# Patient Record
Sex: Male | Born: 1975 | Race: Black or African American | Hispanic: No | Smoking: Current every day smoker
Health system: Southern US, Community
[De-identification: ages and names within clinical notes are randomized; demographics above are authoritative.]

## PROBLEM LIST (undated history)

## (undated) DIAGNOSIS — Z789 Other specified health status: Secondary | ICD-10-CM

---

## 2001-12-24 ENCOUNTER — Encounter: Payer: Self-pay | Admitting: Emergency Medicine

## 2001-12-24 ENCOUNTER — Emergency Department (HOSPITAL_COMMUNITY): Admission: EM | Admit: 2001-12-24 | Discharge: 2001-12-24 | Payer: Self-pay | Admitting: Emergency Medicine

## 2002-10-06 ENCOUNTER — Encounter: Payer: Self-pay | Admitting: Emergency Medicine

## 2002-10-06 ENCOUNTER — Emergency Department (HOSPITAL_COMMUNITY): Admission: EM | Admit: 2002-10-06 | Discharge: 2002-10-06 | Payer: Self-pay | Admitting: Emergency Medicine

## 2004-02-05 ENCOUNTER — Emergency Department (HOSPITAL_COMMUNITY): Admission: EM | Admit: 2004-02-05 | Discharge: 2004-02-05 | Payer: Self-pay | Admitting: *Deleted

## 2004-04-06 ENCOUNTER — Emergency Department (HOSPITAL_COMMUNITY): Admission: EM | Admit: 2004-04-06 | Discharge: 2004-04-06 | Payer: Self-pay | Admitting: Emergency Medicine

## 2007-01-08 ENCOUNTER — Emergency Department (HOSPITAL_COMMUNITY): Admission: EM | Admit: 2007-01-08 | Discharge: 2007-01-08 | Payer: Self-pay | Admitting: Family Medicine

## 2007-04-30 ENCOUNTER — Emergency Department (HOSPITAL_COMMUNITY): Admission: EM | Admit: 2007-04-30 | Discharge: 2007-04-30 | Payer: Self-pay | Admitting: Emergency Medicine

## 2008-11-17 ENCOUNTER — Emergency Department (HOSPITAL_COMMUNITY): Admission: EM | Admit: 2008-11-17 | Discharge: 2008-11-17 | Payer: Self-pay | Admitting: Family Medicine

## 2010-01-30 ENCOUNTER — Emergency Department (HOSPITAL_COMMUNITY): Admission: EM | Admit: 2010-01-30 | Discharge: 2010-01-30 | Payer: Self-pay | Admitting: Family Medicine

## 2010-01-30 ENCOUNTER — Emergency Department (HOSPITAL_COMMUNITY): Admission: EM | Admit: 2010-01-30 | Discharge: 2010-01-30 | Payer: Self-pay | Admitting: Emergency Medicine

## 2010-04-20 ENCOUNTER — Emergency Department (HOSPITAL_COMMUNITY): Admission: EM | Admit: 2010-04-20 | Discharge: 2010-04-21 | Payer: Self-pay | Admitting: Emergency Medicine

## 2010-10-25 LAB — POCT CARDIAC MARKERS: Troponin i, poc: <0.05 ng/mL (ref 0.00–0.09)

## 2010-10-25 LAB — POCT I-STAT, CHEM 8
BUN: 9 mg/dL (ref 6–23)
Calcium, Ion: 1.19 mmol/L (ref 1.12–1.32)
Chloride: 104 mEq/L (ref 96–112)
Creatinine, Ser: 0.9 mg/dL (ref 0.4–1.5)
Glucose, Bld: 106 mg/dL — ABNORMAL HIGH (ref 70–99)
TCO2: 27 mmol/L (ref 0–100)

## 2010-10-25 LAB — RAPID URINE DRUG SCREEN, HOSP PERFORMED: Tetrahydrocannabinol: POSITIVE — AB

## 2017-01-30 ENCOUNTER — Encounter (HOSPITAL_COMMUNITY): Payer: Self-pay | Admitting: Emergency Medicine

## 2017-01-30 DIAGNOSIS — S02609A Fracture of mandible, unspecified, initial encounter for closed fracture: Principal | ICD-10-CM | POA: Insufficient documentation

## 2017-01-30 DIAGNOSIS — F17200 Nicotine dependence, unspecified, uncomplicated: Secondary | ICD-10-CM | POA: Insufficient documentation
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
## 2017-01-31 ENCOUNTER — Observation Stay (HOSPITAL_COMMUNITY)
Admission: EM | Admit: 2017-01-31 | Discharge: 2017-02-01 | Disposition: A | Discharge status: Home / Self Care | Payer: Self-pay | Attending: Otolaryngology | Admitting: Otolaryngology

## 2017-01-31 ENCOUNTER — Emergency Department (HOSPITAL_COMMUNITY): Payer: Self-pay

## 2017-01-31 ENCOUNTER — Inpatient Hospital Stay (HOSPITAL_COMMUNITY): Payer: Self-pay | Admitting: Certified Registered Nurse Anesthetist

## 2017-01-31 ENCOUNTER — Encounter (HOSPITAL_COMMUNITY)
Admission: EM | Disposition: A | Discharge status: Home / Self Care | Payer: Self-pay | Source: Home / Self Care | Attending: Otolaryngology

## 2017-01-31 DIAGNOSIS — S02602A Fracture of unspecified part of body of left mandible, initial encounter for closed fracture: Secondary | ICD-10-CM

## 2017-01-31 DIAGNOSIS — S02609A Fracture of mandible, unspecified, initial encounter for closed fracture: Secondary | ICD-10-CM | POA: Diagnosis present

## 2017-01-31 DIAGNOSIS — K029 Dental caries, unspecified: Secondary | ICD-10-CM

## 2017-01-31 HISTORY — PX: ORIF MANDIBULAR FRACTURE: SHX2127

## 2017-01-31 LAB — CBC WITH DIFFERENTIAL/PLATELET
BASOS ABS: 0 10*3/uL (ref 0.0–0.1)
BASOS PCT: 0 %
EOS PCT: 0 %
Eosinophils Absolute: 0.1 10*3/uL (ref 0.0–0.7)
HCT: 41.4 % (ref 39.0–52.0)
Hemoglobin: 14.2 g/dL (ref 13.0–17.0)
Lymphocytes Relative: 12 %
Lymphs Abs: 1.6 10*3/uL (ref 0.7–4.0)
MCH: 29.9 pg (ref 26.0–34.0)
MCHC: 34.3 g/dL (ref 30.0–36.0)
MCV: 87.2 fL (ref 78.0–100.0)
MONO ABS: 0.7 10*3/uL (ref 0.1–1.0)
Monocytes Relative: 6 %
NEUTROS ABS: 10.9 10*3/uL — AB (ref 1.7–7.7)
Neutrophils Relative %: 82 %
Platelets: 266 10*3/uL (ref 150–400)
RBC: 4.75 MIL/uL (ref 4.22–5.81)
RDW: 14.4 % (ref 11.5–15.5)
WBC: 13.3 10*3/uL — ABNORMAL HIGH (ref 4.0–10.5)

## 2017-01-31 LAB — BASIC METABOLIC PANEL
Anion gap: 12 (ref 5–15)
BUN: 8 mg/dL (ref 6–20)
CHLORIDE: 103 mmol/L (ref 101–111)
CO2: 22 mmol/L (ref 22–32)
CREATININE: 0.77 mg/dL (ref 0.61–1.24)
Calcium: 9.4 mg/dL (ref 8.9–10.3)
GFR calc Af Amer: >60 mL/min (ref >60)
GFR calc non Af Amer: >60 mL/min (ref >60)
GLUCOSE: 120 mg/dL — AB (ref 65–99)
POTASSIUM: 3.6 mmol/L (ref 3.5–5.1)
Sodium: 137 mmol/L (ref 135–145)

## 2017-01-31 SURGERY — OPEN REDUCTION INTERNAL FIXATION (ORIF) MANDIBULAR FRACTURE
Anesthesia: General | Site: Mouth

## 2017-01-31 MED ORDER — SUCCINYLCHOLINE CHLORIDE 200 MG/10ML IV SOSY
PREFILLED_SYRINGE | INTRAVENOUS | Status: AC
  Filled 2017-01-31: qty 10

## 2017-01-31 MED ORDER — CLINDAMYCIN HCL 300 MG PO CAPS: 300.0000 mg | ORAL_CAPSULE | Freq: Three times a day (TID) | ORAL | 0 refills | Status: DC

## 2017-01-31 MED ORDER — HYDROCODONE-ACETAMINOPHEN 7.5-325 MG/15ML PO SOLN: 15.0000 mL | Freq: Four times a day (QID) | ORAL | 0 refills | Status: DC | PRN

## 2017-01-31 MED ORDER — BACITRACIN ZINC 500 UNIT/GM EX OINT
TOPICAL_OINTMENT | CUTANEOUS | Status: AC
  Filled 2017-01-31: qty 28.35

## 2017-01-31 MED ORDER — ONDANSETRON HCL 4 MG/2ML IJ SOLN
4.0000 mg | Freq: Once | INTRAMUSCULAR | Status: AC
  Administered 2017-01-31: 4 mg via INTRAVENOUS
  Filled 2017-01-31: qty 2

## 2017-01-31 MED ORDER — ONDANSETRON HCL 4 MG/2ML IJ SOLN: 4.0000 mg | Freq: Three times a day (TID) | INTRAMUSCULAR | Status: AC | PRN

## 2017-01-31 MED ORDER — OXYMETAZOLINE HCL 0.05 % NA SOLN
NASAL | Status: AC
  Filled 2017-01-31: qty 15

## 2017-01-31 MED ORDER — PROPOFOL 10 MG/ML IV BOLUS
INTRAVENOUS | Status: AC
  Filled 2017-01-31: qty 20

## 2017-01-31 MED ORDER — OXYMETAZOLINE HCL 0.05 % NA SOLN
NASAL | Status: DC | PRN
  Administered 2017-01-31: 1

## 2017-01-31 MED ORDER — MIDAZOLAM HCL 2 MG/2ML IJ SOLN
INTRAMUSCULAR | Status: DC | PRN
  Administered 2017-01-31: 2 mg via INTRAVENOUS

## 2017-01-31 MED ORDER — SUGAMMADEX SODIUM 200 MG/2ML IV SOLN
INTRAVENOUS | Status: DC | PRN
  Administered 2017-01-31: 150 mg via INTRAVENOUS

## 2017-01-31 MED ORDER — MIDAZOLAM HCL 2 MG/2ML IJ SOLN
INTRAMUSCULAR | Status: AC
  Filled 2017-01-31: qty 2

## 2017-01-31 MED ORDER — LIDOCAINE-EPINEPHRINE 1 %-1:100000 IJ SOLN
INTRAMUSCULAR | Status: AC
  Filled 2017-01-31: qty 1

## 2017-01-31 MED ORDER — SODIUM CHLORIDE 0.9 % IV SOLN
INTRAVENOUS | Status: DC
  Administered 2017-01-31: 04:00:00 via INTRAVENOUS

## 2017-01-31 MED ORDER — DEXTROSE-NACL 5-0.9 % IV SOLN
INTRAVENOUS | Status: DC
  Administered 2017-01-31 – 2017-02-01 (×3): via INTRAVENOUS

## 2017-01-31 MED ORDER — LACTATED RINGERS IV SOLN
INTRAVENOUS | Status: DC
  Administered 2017-01-31 (×2): via INTRAVENOUS

## 2017-01-31 MED ORDER — PROPOFOL 10 MG/ML IV BOLUS
INTRAVENOUS | Status: DC | PRN
  Administered 2017-01-31: 200 mg via INTRAVENOUS

## 2017-01-31 MED ORDER — FENTANYL CITRATE (PF) 250 MCG/5ML IJ SOLN
INTRAMUSCULAR | Status: AC
  Filled 2017-01-31: qty 5

## 2017-01-31 MED ORDER — FENTANYL CITRATE (PF) 100 MCG/2ML IJ SOLN
100.0000 ug | Freq: Once | INTRAMUSCULAR | Status: AC
  Administered 2017-01-31: 100 ug via INTRAVENOUS
  Filled 2017-01-31: qty 2

## 2017-01-31 MED ORDER — CLINDAMYCIN PHOSPHATE 600 MG/50ML IV SOLN
INTRAVENOUS | Status: AC
  Filled 2017-01-31: qty 50

## 2017-01-31 MED ORDER — IBUPROFEN 100 MG/5ML PO SUSP
400.0000 mg | Freq: Four times a day (QID) | ORAL | Status: DC | PRN
  Administered 2017-02-01: 400 mg via ORAL
  Filled 2017-01-31 (×3): qty 20

## 2017-01-31 MED ORDER — ROCURONIUM BROMIDE 10 MG/ML (PF) SYRINGE
PREFILLED_SYRINGE | INTRAVENOUS | Status: AC
  Filled 2017-01-31: qty 5

## 2017-01-31 MED ORDER — PROMETHAZINE HCL 25 MG RE SUPP: 25.0000 mg | Freq: Four times a day (QID) | RECTAL | 1 refills | Status: DC | PRN

## 2017-01-31 MED ORDER — FENTANYL CITRATE (PF) 100 MCG/2ML IJ SOLN: 50.0000 ug | INTRAMUSCULAR | Status: DC | PRN

## 2017-01-31 MED ORDER — DEXAMETHASONE SODIUM PHOSPHATE 10 MG/ML IJ SOLN
INTRAMUSCULAR | Status: DC | PRN
  Administered 2017-01-31: 10 mg via INTRAVENOUS

## 2017-01-31 MED ORDER — DEXAMETHASONE SODIUM PHOSPHATE 10 MG/ML IJ SOLN
INTRAMUSCULAR | Status: AC
  Filled 2017-01-31: qty 1

## 2017-01-31 MED ORDER — CLINDAMYCIN PHOSPHATE 900 MG/50ML IV SOLN
900.0000 mg | INTRAVENOUS | Status: AC
  Administered 2017-01-31: 900 mg via INTRAVENOUS
  Filled 2017-01-31: qty 50

## 2017-01-31 MED ORDER — ROCURONIUM BROMIDE 100 MG/10ML IV SOLN
INTRAVENOUS | Status: DC | PRN
  Administered 2017-01-31: 65 mg via INTRAVENOUS

## 2017-01-31 MED ORDER — HYDROCODONE-ACETAMINOPHEN 7.5-325 MG/15ML PO SOLN
10.0000 mL | ORAL | Status: DC | PRN
  Administered 2017-01-31 – 2017-02-01 (×6): 15 mL via ORAL
  Filled 2017-01-31 (×7): qty 15

## 2017-01-31 MED ORDER — FENTANYL CITRATE (PF) 100 MCG/2ML IJ SOLN
INTRAMUSCULAR | Status: DC | PRN
  Administered 2017-01-31: 50 ug via INTRAVENOUS
  Administered 2017-01-31: 150 ug via INTRAVENOUS
  Administered 2017-01-31: 50 ug via INTRAVENOUS

## 2017-01-31 MED ORDER — ONDANSETRON HCL 4 MG/2ML IJ SOLN
INTRAMUSCULAR | Status: AC
  Filled 2017-01-31: qty 2

## 2017-01-31 MED ORDER — PROMETHAZINE HCL 25 MG RE SUPP: 25.0000 mg | Freq: Four times a day (QID) | RECTAL | Status: DC | PRN

## 2017-01-31 MED ORDER — DEXTROSE-NACL 5-0.45 % IV SOLN
INTRAVENOUS | Status: DC
  Administered 2017-01-31: 10:00:00 via INTRAVENOUS

## 2017-01-31 MED ORDER — PROMETHAZINE HCL 25 MG PO TABS: 25.0000 mg | ORAL_TABLET | Freq: Four times a day (QID) | ORAL | Status: DC | PRN

## 2017-01-31 MED ORDER — SUGAMMADEX SODIUM 200 MG/2ML IV SOLN
INTRAVENOUS | Status: AC
  Filled 2017-01-31: qty 2

## 2017-01-31 MED ORDER — SODIUM CHLORIDE 0.9 % IV SOLN
Freq: Once | INTRAVENOUS | Status: AC
  Administered 2017-01-31: 02:00:00 via INTRAVENOUS

## 2017-01-31 MED ORDER — OXYMETAZOLINE HCL 0.05 % NA SOLN
2.0000 | NASAL | Status: DC
  Administered 2017-01-31: 2 via NASAL

## 2017-01-31 MED ORDER — CLINDAMYCIN PHOSPHATE 600 MG/50ML IV SOLN
600.0000 mg | Freq: Once | INTRAVENOUS | Status: AC
  Administered 2017-01-31: 600 mg via INTRAVENOUS
  Filled 2017-01-31: qty 50

## 2017-01-31 MED ORDER — CLINDAMYCIN PHOSPHATE 900 MG/50ML IV SOLN
INTRAVENOUS | Status: AC
  Filled 2017-01-31: qty 50

## 2017-01-31 MED ORDER — ONDANSETRON HCL 4 MG/2ML IJ SOLN
INTRAMUSCULAR | Status: DC | PRN
  Administered 2017-01-31: 4 mg via INTRAVENOUS

## 2017-01-31 MED ORDER — LIDOCAINE 2% (20 MG/ML) 5 ML SYRINGE
INTRAMUSCULAR | Status: AC
  Filled 2017-01-31: qty 5

## 2017-01-31 MED ORDER — CLINDAMYCIN PALMITATE HCL 75 MG/5ML PO SOLR
300.0000 mg | Freq: Three times a day (TID) | ORAL | Status: DC
  Administered 2017-01-31 – 2017-02-01 (×3): 300 mg via ORAL
  Filled 2017-01-31 (×4): qty 20

## 2017-01-31 MED ORDER — LIDOCAINE 2% (20 MG/ML) 5 ML SYRINGE
INTRAMUSCULAR | Status: DC | PRN
  Administered 2017-01-31: 100 mg via INTRAVENOUS

## 2017-01-31 MED ORDER — 0.9 % SODIUM CHLORIDE (POUR BTL) OPTIME
TOPICAL | Status: DC | PRN
  Administered 2017-01-31: 1000 mL

## 2017-01-31 MED ORDER — FENTANYL CITRATE (PF) 100 MCG/2ML IJ SOLN
50.0000 ug | INTRAMUSCULAR | Status: DC | PRN
  Administered 2017-01-31 (×2): 50 ug via INTRAVENOUS
  Filled 2017-01-31 (×2): qty 2

## 2017-01-31 SURGICAL SUPPLY — 33 items
BLADE SURG 15 STRL LF DISP TIS (BLADE) IMPLANT
BLADE SURG 15 STRL SS (BLADE)
CANISTER SUCT 3000ML PPV (MISCELLANEOUS) ×3 IMPLANT
CLEANER TIP ELECTROSURG 2X2 (MISCELLANEOUS) ×3 IMPLANT
COVER SURGICAL LIGHT HANDLE (MISCELLANEOUS) ×3 IMPLANT
DRAPE HALF SHEET 40X57 (DRAPES) ×3 IMPLANT
ELECT COATED BLADE 2.86 ST (ELECTRODE) IMPLANT
ELECT NEEDLE TIP 2.8 STRL (NEEDLE) IMPLANT
ELECT REM PT RETURN 9FT ADLT (ELECTROSURGICAL)
ELECTRODE REM PT RTRN 9FT ADLT (ELECTROSURGICAL) IMPLANT
GLOVE ECLIPSE 7.5 STRL STRAW (GLOVE) ×3 IMPLANT
GOWN STRL REUS W/ TWL LRG LVL3 (GOWN DISPOSABLE) ×2 IMPLANT
GOWN STRL REUS W/TWL LRG LVL3 (GOWN DISPOSABLE) ×4
KIT BASIN OR (CUSTOM PROCEDURE TRAY) ×3 IMPLANT
KIT ROOM TURNOVER OR (KITS) ×3 IMPLANT
NEEDLE PRECISIONGLIDE 27X1.5 (NEEDLE) ×3 IMPLANT
NS IRRIG 1000ML POUR BTL (IV SOLUTION) ×3 IMPLANT
PAD ARMBOARD 7.5X6 YLW CONV (MISCELLANEOUS) ×6 IMPLANT
PENCIL FOOT CONTROL (ELECTRODE) ×3 IMPLANT
PLATE HYBRID MMF (Plate) ×6 IMPLANT
SCISSORS WIRE ANG 4 3/4 DISP (INSTRUMENTS) ×3 IMPLANT
SCREW LOCK SELFDRIL 2.0X8M MMF (Screw) ×15 IMPLANT
SCREW LOCKING SELF DRILL 2.0X6 (Screw) ×18 IMPLANT
SUT CHROMIC 3 0 PS 2 (SUTURE) ×3 IMPLANT
SUT STEEL 0 (SUTURE)
SUT STEEL 0 18XMFL TIE 17 (SUTURE) IMPLANT
SUT STEEL 2 (SUTURE) IMPLANT
SUT STEEL 4 (SUTURE) IMPLANT
SUT VICRYL 4-0 PS2 18IN ABS (SUTURE) IMPLANT
TOWEL OR 17X24 6PK STRL BLUE (TOWEL DISPOSABLE) ×3 IMPLANT
TRAY ENT MC OR (CUSTOM PROCEDURE TRAY) ×3 IMPLANT
WATER STERILE IRR 1000ML POUR (IV SOLUTION) ×3 IMPLANT
WIRE K 1.1 (MISCELLANEOUS) ×3 IMPLANT

## 2017-01-31 NOTE — Transfer of Care (Signed)
Immediate Anesthesia Transfer of Care Note  Patient: Randy Strickland  Procedure(s) Performed: Procedure(s): OPEN REDUCTION INTERNAL FIXATION (ORIF) MANDIBULAR FRACTURE (N/A)  Patient Location: PACU  Anesthesia Type:General  Level of Consciousness: drowsy  Airway & Oxygen Therapy: Patient Spontanous Breathing and Patient connected to nasal cannula oxygen  Post-op Assessment: Report given to RN, Post -op Vital signs reviewed and stable and Patient moving all extremities  Post vital signs: Reviewed and stable  Last Vitals:  Vitals:   01/31/17 0217 01/31/17 0404  BP: 134/76 134/72  Pulse: 66 (!) 58  Resp: 20 18  Temp:  36.7 C    Last Pain:  Vitals:   01/31/17 0616  TempSrc:   PainSc: 8       Patients Stated Pain Goal: 2 (01/31/17 0616)  Complications: No apparent anesthesia complications

## 2017-01-31 NOTE — ED Notes (Signed)
[facility] called for transfer to .  

## 2017-01-31 NOTE — Anesthesia Preprocedure Evaluation (Addendum)
Anesthesia Evaluation  Patient identified by MRN, date of birth, ID band Patient awake    Reviewed: Allergy & Precautions, H&P , Patient's Chart, lab work & pertinent test results, reviewed documented beta blocker date and time   Airway Mallampati: II  TM Distance: >3 FB Neck ROM: full    Dental no notable dental hx.    Pulmonary Current Smoker,    Pulmonary exam normal breath sounds clear to auscultation       Cardiovascular  Rhythm:regular Rate:Normal     Neuro/Psych    GI/Hepatic   Endo/Other    Renal/GU      Musculoskeletal   Abdominal   Peds  Hematology   Anesthesia Other Findings   Reproductive/Obstetrics                             Anesthesia Physical Anesthesia Plan  ASA: II  Anesthesia Plan: General   Post-op Pain Management:    Induction: Intravenous  PONV Risk Score and Plan: 2 and Ondansetron, Dexamethasone, Propofol, Scopolamine patch - Pre-op and Promethazine  Airway Management Planned: Nasal ETT  Additional Equipment:   Intra-op Plan:   Post-operative Plan: Extubation in OR  Informed Consent: I have reviewed the patients History and Physical, chart, labs and discussed the procedure including the risks, benefits and alternatives for the proposed anesthesia with the patient or authorized representative who has indicated his/her understanding and acceptance.   Dental Advisory Given  Plan Discussed with: CRNA and Surgeon  Anesthesia Plan Comments: (  )      Anesthesia Quick Evaluation

## 2017-01-31 NOTE — Discharge Instructions (Signed)
Liquids only.  Always have the wire cutter with you. In case of vomiting, cut 4 wires to open the mouth.  Bruch teeth twice daily as well as you can.  Rinse mouth with salt water 3-4 times daily.  It is OK to open the antibiotic (clindamycin) capsules and to drink the granules with liquid.
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
## 2017-01-31 NOTE — Anesthesia Procedure Notes (Signed)
Procedure Name: Intubation Date/Time: 01/31/2017 8:26 AM Performed by: Trixie Deis A Pre-anesthesia Checklist: Patient identified, Emergency Drugs available, Suction available, Patient being monitored and Timeout performed Patient Re-evaluated:Patient Re-evaluated prior to inductionOxygen Delivery Method: Circle system utilized Preoxygenation: Pre-oxygenation with 100% oxygen Intubation Type: IV induction Ventilation: Mask ventilation without difficulty Laryngoscope Size: Mac and 4 Grade View: Grade I Nasal Tubes: Right, Nasal prep performed, Nasal Rae and Magill forceps- large, utilized Tube size: 7.5 mm Number of attempts: 2 Placement Confirmation: ETT inserted through vocal cords under direct vision and breath sounds checked- equal and bilateral Secured at: 29 cm Tube secured with: Tape

## 2017-01-31 NOTE — Progress Notes (Signed)
Dr. Pollyann Kennedyosen notified of patient's arrival.

## 2017-01-31 NOTE — Progress Notes (Signed)
Patient back from PACU. Suction and wire cutters at bedside.
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
## 2017-01-31 NOTE — Progress Notes (Signed)
   Subjective:    Patient ID: Randy Strickland, male    DOB: 08-30-1975, 41 y.o.   MRN: [id number]  HPI Doing well.  Not much pain.  Review of Systems     Objective:   Physical Exam AF VSS Alert, NAD Teeth in tight occlusion with MMF bars in place    Assessment & Plan:  Mandible fracture s/p MMF  Doing well.  He prefers to stay overnight.  Plan discharge tomorrow.

## 2017-01-31 NOTE — Op Note (Signed)
OPERATIVE REPORT  DATE OF SURGERY: 01/31/2017  PATIENT:  Myna HidalgoFrankie J Hildenbrand,  41 y.o. male  PRE-OPERATIVE DIAGNOSIS:  fractured mandible  POST-OPERATIVE DIAGNOSIS:  fractured mandible  PROCEDURE:  Procedure(s): OPEN REDUCTION INTERNAL FIXATION (ORIF) MANDIBULAR FRACTURE  SURGEON:  Susy FrizzleJefry H Ninel Abdella, MD  ASSISTANTS: none  ANESTHESIA:   General   EBL:  5 ml  DRAINS: none  LOCAL MEDICATIONS USED:  None  SPECIMEN:  none  COUNTS:  Correct  PROCEDURE DETAILS: The patient was taken to the operating room and placed on the operating table in the supine position. Following induction of general nasotracheal anesthesia, the face was draped in a standard fashion. A cheek retractor was used throughout the case. The dentition was inspected. Heath and fractured teeth. There was an open bite deformity. There is an obvious displaced fracture of the left angle/ascending ramus. Arch bars were applied to the maxilla and the mandible using the drilling 8 mm screws in the maxilla and 9 mm in the mandible. Care was taken to avoid tooth roots. 24-gauge wire loops were then used to bring the occlusion into position and secured the maxilla mandibular fixation. A total of 4 wires were used. A patient of the angle on the left side after the fixation was anatomically aligned. The oral cavity was irrigated with saline and suctioned. Patient was then awakened and admitted and transferred to recovery in stable condition.    PATIENT DISPOSITION:  To PACU, stable

## 2017-02-01 NOTE — Discharge Planning (Signed)
Patient discharged home in stable condition. Verbalizes understanding of all discharge instructions, including home medications and follow up appointments. 

## 2017-02-01 NOTE — Discharge Summary (Signed)
Physician Discharge Summary  Patient ID: Randy HidalgoFrankie J Scheaffer MRN: [id number] DOB/AGE: 09-08-1975 41 y.o.  Admit date: 01/31/2017 Discharge date: 02/01/2017  Admission Diagnoses: Mandible fracture  Discharge Diagnoses:  Active Problems:   Mandible fracture (HCC)   Fracture, mandible (HCC)   Discharged Condition: good  Hospital Course: 41 year old male with mandible fracture presented for maxillomandibular fixation.  See operative note.  He was observed overnight and did well with reasonably good pain control.  On POD 1, he was felt stable for discharge.  Consults: None  Significant Diagnostic Studies: None  Treatments: surgery: Maxillomandibular fixation  Discharge Exam: Blood pressure 106/62, pulse (!) 54, temperature 98.1 F (36.7 C), temperature source Axillary, resp. rate 18, height 5\' 10"  (1.778 m), weight 163 lb 4.8 oz (74.1 kg), SpO2 98 %. General appearance: alert, cooperative and no distress Head: Teeth in normal, tight occlusion.  Arch bars and wires in place.  Disposition: Final discharge disposition not confirmed  Discharge Instructions    Diet - low sodium heart healthy    Complete by:  As directed    Discharge instructions    Complete by:  As directed    Diet consists of anything you can get through your teeth.  Drink plenty of fluids including Boost or Ensure shakes.  Blended foods or other thin solids can also be eaten.  OK to gently brush teeth around wires.  Call if wires break or loosen.  Keep a pair of wire cutters with you at all times and cut wires only in emergency such as needing to throw up.  If you cut the wires, call Dr. Pollyann Kennedyosen immediately.   Increase activity slowly    Complete by:  As directed      Allergies as of 02/01/2017   No Known Allergies     Medication List    TAKE these medications   clindamycin 300 MG capsule Commonly known as:  CLEOCIN Take 1 capsule (300 mg total) by mouth 3 (three) times daily.   HYDROcodone-acetaminophen  7.5-325 mg/15 ml solution Commonly known as:  HYCET Take 15 mLs by mouth 4 (four) times daily as needed for moderate pain.   promethazine 25 MG suppository Commonly known as:  PHENERGAN Place 1 suppository (25 mg total) rectally every 6 (six) hours as needed for nausea or vomiting.      Follow-up Information    Serena Colonelosen, Jefry, MD. Schedule an appointment as soon as possible for a visit in 1 week(s).   Specialty:  Otolaryngology Contact information: [address] [place] [address] [phone]           Signed: Christia ReadingBATES, Gopal Malter 02/01/2017, 8:44 AM

## 2017-02-03 ENCOUNTER — Encounter (HOSPITAL_COMMUNITY): Payer: Self-pay | Admitting: Otolaryngology

## 2017-02-03 NOTE — Anesthesia Postprocedure Evaluation (Signed)
Anesthesia Post Note  Patient: Randy Strickland  Procedure(s) Performed: Procedure(s) (LRB): OPEN REDUCTION INTERNAL FIXATION (ORIF) MANDIBULAR FRACTURE (N/A)     Patient location during evaluation: PACU Anesthesia Type: General Level of consciousness: awake and alert Pain management: pain level controlled Vital Signs Assessment: post-procedure vital signs reviewed and stable Respiratory status: spontaneous breathing, nonlabored ventilation, respiratory function stable and patient connected to nasal cannula oxygen Cardiovascular status: blood pressure returned to baseline and stable Postop Assessment: no signs of nausea or vomiting Anesthetic complications: no    Last Vitals:  Vitals:   02/01/17 0203 02/01/17 0500  BP: 119/61 106/62  Pulse: (!) 58 (!) 54  Resp: 18 18  Temp: 36.6 C 36.7 C    Last Pain:  Vitals:   02/01/17 0940  TempSrc:   PainSc: 2                  Randy Strickland EDWARD

## 2017-03-05 ENCOUNTER — Other Ambulatory Visit: Payer: Self-pay | Admitting: Otolaryngology

## 2017-03-06 NOTE — Pre-Procedure Instructions (Signed)
Randy Strickland  03/06/2017      [facility] - [place], [place] - [address] [place] [id number] Phone: [phone] Fax: [phone]    Your procedure is scheduled on August 1  [facility] at 0800 A.M.  Call this number if you have problems the morning of surgery:  [phone]   Remember:  Do not eat food or drink liquids after midnight.   Take these medicines the morning of surgery with A SIP OF WATER  None  7 days prior to surgery STOP taking any Aspirin, Aleve, Naproxen, Ibuprofen, Motrin, Advil, Goody's, BC's, all herbal medications, fish oil, and all vitamins    Do not wear jewelry  Do not wear lotions, powders, or cologne, or deoderant.  Men may shave face and neck.  Do not bring valuables to the hospital.  [facility] is not responsible for any belongings or valuables.  Contacts, dentures or bridgework may not be worn into surgery.  Leave your suitcase in the car.  After surgery it may be brought to your room.  For patients admitted to the hospital, discharge time will be determined by your treatment team.  Patients discharged the day of surgery will not be allowed to drive home.    Special instructions:   [facility]- Preparing For Surgery  Before surgery, you can play an important role. Because skin is not sterile, your skin needs to be as free of germs as possible. You can reduce the number of germs on your skin by washing with CHG (chlorahexidine gluconate) Soap before surgery.  CHG is an antiseptic cleaner which kills germs and bonds with the skin to continue killing germs even after washing.  Please do not use if you have an allergy to CHG or antibacterial soaps. If your skin becomes reddened/irritated stop using the CHG.  Do not shave (including legs and underarms) for at least 48 hours prior to first CHG shower. It is OK to shave your face.  Please follow  these instructions carefully.   1. Shower the NIGHT BEFORE SURGERY and the MORNING OF SURGERY with CHG.   2. If you chose to wash your hair, wash your hair first as usual with your normal shampoo.  3. After you shampoo, rinse your hair and body thoroughly to remove the shampoo.  4. Use CHG as you would any other liquid soap. You can apply CHG directly to the skin and wash gently with a scrungie or a clean washcloth.   5. Apply the CHG Soap to your body ONLY FROM THE NECK DOWN.  Do not use on open wounds or open sores. Avoid contact with your eyes, ears, mouth and genitals (private parts). Wash genitals (private parts) with your normal soap.  6. Wash thoroughly, paying special attention to the area where your surgery will be performed.  7. Thoroughly rinse your body with warm water from the neck down.  8. DO NOT shower/wash with your normal soap after using and rinsing off the CHG Soap.  9. Pat yourself dry with a CLEAN TOWEL.   10. Wear CLEAN PAJAMAS   11. Place CLEAN SHEETS on your bed the night of your first shower and DO NOT SLEEP WITH PETS.    Day of Surgery: Do not apply any deodorants/lotions. Please wear clean clothes to the hospital/surgery center.      Please read over the following fact sheets that you were given.

## 2017-03-07 ENCOUNTER — Encounter (INDEPENDENT_AMBULATORY_CARE_PROVIDER_SITE_OTHER): Payer: Self-pay

## 2017-03-07 ENCOUNTER — Encounter (HOSPITAL_COMMUNITY)
Admission: RE | Admit: 2017-03-07 | Discharge: 2017-03-07 | Disposition: A | Discharge status: Home / Self Care | Payer: Self-pay | Source: Ambulatory Visit | Attending: Otolaryngology | Admitting: Otolaryngology

## 2017-03-07 ENCOUNTER — Encounter (HOSPITAL_COMMUNITY): Payer: Self-pay

## 2017-03-07 DIAGNOSIS — Z01812 Encounter for preprocedural laboratory examination: Secondary | ICD-10-CM | POA: Insufficient documentation

## 2017-03-07 HISTORY — DX: Other specified health status: Z78.9

## 2017-03-07 LAB — CBC
HEMATOCRIT: 41 % (ref 39.0–52.0)
HEMOGLOBIN: 13.8 g/dL (ref 13.0–17.0)
MCH: 29.6 pg (ref 26.0–34.0)
MCHC: 33.7 g/dL (ref 30.0–36.0)
MCV: 88 fL (ref 78.0–100.0)
Platelets: 259 10*3/uL (ref 150–400)
RBC: 4.66 MIL/uL (ref 4.22–5.81)
RDW: 14.4 % (ref 11.5–15.5)
WBC: 7.2 10*3/uL (ref 4.0–10.5)

## 2017-03-07 NOTE — Progress Notes (Signed)
Denies having a PCP Denies ever seeing a cardiologist. Denies any chest pain. Denies ever having a card cath, stress test, or echo. Instructed not to smoke 24 hours prior to surgery.

## 2017-03-12 ENCOUNTER — Ambulatory Visit (HOSPITAL_COMMUNITY): Payer: Self-pay | Admitting: Certified Registered Nurse Anesthetist

## 2017-03-12 ENCOUNTER — Encounter (HOSPITAL_COMMUNITY): Payer: Self-pay | Admitting: Orthopedic Surgery

## 2017-03-12 ENCOUNTER — Ambulatory Visit (HOSPITAL_COMMUNITY)
Admission: RE | Admit: 2017-03-12 | Discharge: 2017-03-12 | Disposition: A | Discharge status: Home / Self Care | Payer: Self-pay | Source: Ambulatory Visit | Attending: Otolaryngology | Admitting: Otolaryngology

## 2017-03-12 ENCOUNTER — Encounter (HOSPITAL_COMMUNITY)
Admission: RE | Disposition: A | Discharge status: Home / Self Care | Payer: Self-pay | Source: Ambulatory Visit | Attending: Otolaryngology

## 2017-03-12 DIAGNOSIS — X58XXXD Exposure to other specified factors, subsequent encounter: Secondary | ICD-10-CM | POA: Insufficient documentation

## 2017-03-12 DIAGNOSIS — S02642D Fracture of ramus of left mandible, subsequent encounter for fracture with routine healing: Secondary | ICD-10-CM | POA: Insufficient documentation

## 2017-03-12 DIAGNOSIS — F17210 Nicotine dependence, cigarettes, uncomplicated: Secondary | ICD-10-CM | POA: Insufficient documentation

## 2017-03-12 HISTORY — PX: MANDIBULAR HARDWARE REMOVAL: SHX5205

## 2017-03-12 SURGERY — REMOVAL, HARDWARE, MANDIBLE
Anesthesia: Monitor Anesthesia Care | Site: Mouth

## 2017-03-12 MED ORDER — MIDAZOLAM HCL 2 MG/2ML IJ SOLN
INTRAMUSCULAR | Status: AC
  Filled 2017-03-12: qty 2

## 2017-03-12 MED ORDER — PROPOFOL 500 MG/50ML IV EMUL
INTRAVENOUS | Status: DC | PRN
  Administered 2017-03-12: 50 ug/kg/min via INTRAVENOUS

## 2017-03-12 MED ORDER — ONDANSETRON HCL 4 MG/2ML IJ SOLN
INTRAMUSCULAR | Status: AC
  Filled 2017-03-12: qty 2

## 2017-03-12 MED ORDER — 0.9 % SODIUM CHLORIDE (POUR BTL) OPTIME
TOPICAL | Status: DC | PRN
  Administered 2017-03-12: 1000 mL

## 2017-03-12 MED ORDER — GLYCOPYRROLATE 0.2 MG/ML IJ SOLN
INTRAMUSCULAR | Status: DC | PRN
  Administered 2017-03-12: 0.2 mg via INTRAVENOUS

## 2017-03-12 MED ORDER — CLINDAMYCIN HCL 300 MG PO CAPS: 300.0000 mg | ORAL_CAPSULE | Freq: Three times a day (TID) | ORAL | 0 refills | Status: DC

## 2017-03-12 MED ORDER — FENTANYL CITRATE (PF) 100 MCG/2ML IJ SOLN
INTRAMUSCULAR | Status: DC | PRN
  Administered 2017-03-12 (×2): 50 ug via INTRAVENOUS

## 2017-03-12 MED ORDER — HYDROMORPHONE HCL 1 MG/ML IJ SOLN: 0.2500 mg | INTRAMUSCULAR | Status: DC | PRN

## 2017-03-12 MED ORDER — LIDOCAINE-EPINEPHRINE 1 %-1:100000 IJ SOLN
INTRAMUSCULAR | Status: DC | PRN
  Administered 2017-03-12: 5 mL

## 2017-03-12 MED ORDER — PROPOFOL 10 MG/ML IV BOLUS
INTRAVENOUS | Status: AC
  Filled 2017-03-12: qty 20

## 2017-03-12 MED ORDER — LIDOCAINE 2% (20 MG/ML) 5 ML SYRINGE
INTRAMUSCULAR | Status: AC
  Filled 2017-03-12: qty 5

## 2017-03-12 MED ORDER — LACTATED RINGERS IV SOLN
INTRAVENOUS | Status: DC | PRN
  Administered 2017-03-12 (×2): via INTRAVENOUS

## 2017-03-12 MED ORDER — PROPOFOL 10 MG/ML IV BOLUS
INTRAVENOUS | Status: DC | PRN
  Administered 2017-03-12 (×3): 20 mg via INTRAVENOUS

## 2017-03-12 MED ORDER — LIDOCAINE 2% (20 MG/ML) 5 ML SYRINGE
INTRAMUSCULAR | Status: DC | PRN
  Administered 2017-03-12: 60 mg via INTRAVENOUS

## 2017-03-12 MED ORDER — ONDANSETRON HCL 4 MG/2ML IJ SOLN
INTRAMUSCULAR | Status: DC | PRN
  Administered 2017-03-12: 4 mg via INTRAVENOUS

## 2017-03-12 MED ORDER — MIDAZOLAM HCL 5 MG/5ML IJ SOLN
INTRAMUSCULAR | Status: DC | PRN
  Administered 2017-03-12: 2 mg via INTRAVENOUS

## 2017-03-12 MED ORDER — HYDROCODONE-ACETAMINOPHEN 7.5-325 MG PO TABS: 1.0000 | ORAL_TABLET | Freq: Four times a day (QID) | ORAL | 0 refills | Status: DC | PRN

## 2017-03-12 MED ORDER — FENTANYL CITRATE (PF) 250 MCG/5ML IJ SOLN
INTRAMUSCULAR | Status: AC
  Filled 2017-03-12: qty 5

## 2017-03-12 MED ORDER — LIDOCAINE-EPINEPHRINE 1 %-1:100000 IJ SOLN
INTRAMUSCULAR | Status: AC
  Filled 2017-03-12: qty 1

## 2017-03-12 MED ORDER — LACTATED RINGERS IV SOLN
INTRAVENOUS | Status: DC
  Administered 2017-03-12: 09:00:00 via INTRAVENOUS

## 2017-03-12 SURGICAL SUPPLY — 28 items
BLADE SURG 15 STRL LF DISP TIS (BLADE) ×1 IMPLANT
BLADE SURG 15 STRL SS (BLADE) ×2
CANISTER SUCT 3000ML PPV (MISCELLANEOUS) ×3 IMPLANT
COVER BACK TABLE 60X90IN (DRAPES) ×3 IMPLANT
COVER SURGICAL LIGHT HANDLE (MISCELLANEOUS) IMPLANT
DRAPE HALF SHEET 40X57 (DRAPES) ×3 IMPLANT
ELECT COATED BLADE 2.86 ST (ELECTRODE) IMPLANT
ELECT REM PT RETURN 9FT ADLT (ELECTROSURGICAL)
ELECTRODE REM PT RTRN 9FT ADLT (ELECTROSURGICAL) IMPLANT
GAUZE SPONGE 4X4 16PLY XRAY LF (GAUZE/BANDAGES/DRESSINGS) ×3 IMPLANT
GLOVE ECLIPSE 7.5 STRL STRAW (GLOVE) ×3 IMPLANT
GOWN STRL REUS W/ TWL LRG LVL3 (GOWN DISPOSABLE) ×2 IMPLANT
GOWN STRL REUS W/TWL LRG LVL3 (GOWN DISPOSABLE) ×4
KIT BASIN OR (CUSTOM PROCEDURE TRAY) ×3 IMPLANT
KIT ROOM TURNOVER OR (KITS) ×3 IMPLANT
NEEDLE PRECISIONGLIDE 27X1.5 (NEEDLE) ×3 IMPLANT
NS IRRIG 1000ML POUR BTL (IV SOLUTION) ×3 IMPLANT
PAD ARMBOARD 7.5X6 YLW CONV (MISCELLANEOUS) ×3 IMPLANT
PENCIL FOOT CONTROL (ELECTRODE) IMPLANT
SUT CHROMIC 3 0 PS 2 (SUTURE) IMPLANT
SUT CHROMIC 4 0 PS 2 18 (SUTURE) IMPLANT
SUT VICRYL 4-0 PS2 18IN ABS (SUTURE) IMPLANT
SYR BULB 3OZ (MISCELLANEOUS) ×3 IMPLANT
SYR CONTROL 10ML LL (SYRINGE) ×3 IMPLANT
TOWEL OR 17X24 6PK STRL BLUE (TOWEL DISPOSABLE) ×3 IMPLANT
TUBE CONNECTING 12'X1/4 (SUCTIONS) ×1
TUBE CONNECTING 12X1/4 (SUCTIONS) ×2 IMPLANT
YANKAUER SUCT BULB TIP NO VENT (SUCTIONS) ×3 IMPLANT

## 2017-03-12 NOTE — Anesthesia Postprocedure Evaluation (Signed)
Anesthesia Post Note  Patient: Randy Strickland  Procedure(s) Performed: Procedure(s) (LRB): MANDIBULAR HARDWARE REMOVAL (N/A)     Patient location during evaluation: PACU Anesthesia Type: MAC Level of consciousness: awake Pain management: pain level controlled Vital Signs Assessment: post-procedure vital signs reviewed and stable Respiratory status: spontaneous breathing Cardiovascular status: stable Postop Assessment: no headache Anesthetic complications: no    Last Vitals:  Vitals:   03/12/17 1115 03/12/17 1126  BP: 122/74 123/80  Pulse: (!) 57   Resp: 12   Temp: (!) 36.3 C     Last Pain:  Vitals:   03/12/17 0858  TempSrc: Oral                 Lamiah Marmol

## 2017-03-12 NOTE — Anesthesia Procedure Notes (Signed)
Procedure Name: MAC Date/Time: 03/12/2017 10:25 AM Performed by: Garrison Columbus T Pre-anesthesia Checklist: Patient identified, Emergency Drugs available, Suction available and Patient being monitored Patient Re-evaluated:Patient Re-evaluated prior to induction Oxygen Delivery Method: Nasal cannula Preoxygenation: Pre-oxygenation with 100% oxygen Induction Type: IV induction Placement Confirmation: positive ETCO2 and breath sounds checked- equal and bilateral Dental Injury: Teeth and Oropharynx as per pre-operative assessment

## 2017-03-12 NOTE — Transfer of Care (Cosign Needed)
Immediate Anesthesia Transfer of Care Note  Patient: Randy Strickland  Procedure(s) Performed: Procedure(s): MANDIBULAR HARDWARE REMOVAL (N/A)  Patient Location: PACU  Anesthesia Type:MAC  Level of Consciousness: awake, alert  and oriented  Airway & Oxygen Therapy: Patient Spontanous Breathing and Patient connected to nasal cannula oxygen  Post-op Assessment: Report given to RN and Post -op Vital signs reviewed and stable  Post vital signs: Reviewed and stable  Last Vitals:  Vitals:   03/12/17 0858  BP: 106/63  Pulse: (!) 47  Resp: 18  Temp: (!) 36.4 C    Last Pain:  Vitals:   03/12/17 0858  TempSrc: Oral         Complications: No apparent anesthesia complications

## 2017-03-12 NOTE — Anesthesia Preprocedure Evaluation (Addendum)
Anesthesia Evaluation  Patient identified by MRN, date of birth, ID band Patient awake    Reviewed: Allergy & Precautions, NPO status , Patient's Chart, lab work & pertinent test results  Airway      Mouth opening: Limited Mouth Opening Comment: See past notes [facility]  (+) Dental Advisory Given   Pulmonary Current Smoker,    breath sounds clear to auscultation       Cardiovascular negative cardio ROS   Rhythm:Regular Rate:Normal     Neuro/Psych    GI/Hepatic negative GI ROS, Neg liver ROS,   Endo/Other  negative endocrine ROS  Renal/GU negative Renal ROS     Musculoskeletal   Abdominal   Peds  Hematology   Anesthesia Other Findings   Reproductive/Obstetrics                            Anesthesia Physical Anesthesia Plan  ASA: I  Anesthesia Plan: MAC   Post-op Pain Management:    Induction: Intravenous  PONV Risk Score and Plan: 2 and Ondansetron, Dexamethasone and Treatment may vary due to age or medical condition  Airway Management Planned: Nasal Cannula  Additional Equipment:   Intra-op Plan:   Post-operative Plan:   Informed Consent: I have reviewed the patients History and Physical, chart, labs and discussed the procedure including the risks, benefits and alternatives for the proposed anesthesia with the patient or authorized representative who has indicated his/her understanding and acceptance.   Dental advisory given  Plan Discussed with: CRNA and Anesthesiologist  Anesthesia Plan Comments:         Anesthesia Quick Evaluation

## 2017-03-12 NOTE — H&P (Signed)
  Randy Strickland is an 41 y.o. male.   Chief Complaint: mandible fracture HPI: MMF 6 weeks ago, doing well.  Past Medical History:  Diagnosis Date  . Medical history non-contributory     Past Surgical History:  Procedure Laterality Date  . ORIF MANDIBULAR FRACTURE N/A 01/31/2017   Procedure: OPEN REDUCTION INTERNAL FIXATION (ORIF) MANDIBULAR FRACTURE;  Surgeon: Serena Colonelosen, Vineta Carone, MD;  [facility];  Service: ENT;  Laterality: N/A;    Family History  Problem Relation Age of Onset  . Diabetes Mother   . Stroke Father   . Kidney disease Brother    Social History:  reports that he has been smoking.  He has been smoking about 0.25 packs per day. He has never used smokeless tobacco. He reports that he drinks alcohol. He reports that he does not use drugs.  Allergies: No Known Allergies  Medications Prior to Admission  Medication Sig Dispense Refill  . acetaminophen (TYLENOL) 160 MG/5ML suspension Take 320 mg by mouth every 8 (eight) hours as needed for mild pain or moderate pain.    Marland Kitchen. ibuprofen (ADVIL,MOTRIN) 100 MG/5ML suspension Take 600 mg by mouth every 8 (eight) hours as needed for mild pain or moderate pain.    . clindamycin (CLEOCIN) 300 MG capsule Take 1 capsule (300 mg total) by mouth 3 (three) times daily. (Patient not taking: Reported on 03/05/2017) 15 capsule 0  . HYDROcodone-acetaminophen (HYCET) 7.5-325 mg/15 ml solution Take 15 mLs by mouth 4 (four) times daily as needed for moderate pain. (Patient not taking: Reported on 03/05/2017) 480 mL 0  . promethazine (PHENERGAN) 25 MG suppository Place 1 suppository (25 mg total) rectally every 6 (six) hours as needed for nausea or vomiting. (Patient not taking: Reported on 03/05/2017) 12 suppository 1    No results found for this or any previous visit (from the past 48 hour(s)). No results found.  ROS: otherwise negative  Blood pressure 106/63, pulse (!) 47, temperature (!) 97.5 F (36.4 C), temperature source Oral, resp. rate  18, height 5\' 7"  (1.702 m), weight 65.3 kg (144 lb), SpO2 99 %.  PHYSICAL EXAM: Overall appearance:  Healthy appearing, in no distress Head:  Normocephalic, atraumatic. Ears: External auditory canals are clear; tympanic membranes are intact and the middle ears are free of any effusion. Nose: External nose is healthy in appearance. Internal nasal exam free of any lesions or obstruction. Oral Cavity/pharynx:  MMF in place with good occlusion.. Neuro:  No identifiable neurologic deficits. Neck: No palpable neck masses.  Studies Reviewed: none    Assessment/Plan MMF removal.  Randy Strickland 03/12/2017, 10:16 AM

## 2017-03-12 NOTE — Discharge Instructions (Signed)
Rinse mouth with saltwater a few times each day.   Brush teeth as you normally would.   Advance diet as tolerated.

## 2017-03-12 NOTE — Op Note (Signed)
OPERATIVE REPORT  DATE OF SURGERY: 03/12/2017  PATIENT:  Randy Strickland,  41 y.o. male  PRE-OPERATIVE DIAGNOSIS:  Closed Fracture of Left Ramus Mandible  POST-OPERATIVE DIAGNOSIS:  Closed Fracture of Left Ramus Mandible  PROCEDURE:  Procedure(s): MANDIBULAR HARDWARE REMOVAL  SURGEON:  Susy FrizzleJefry H Keriann Rankin, MD  ASSISTANTS: none  ANESTHESIA:   General   EBL:  20 ml  DRAINS: none  LOCAL MEDICATIONS USED:  1% Xylocaine with epinephrine  SPECIMEN:  none  COUNTS:  Correct  PROCEDURE DETAILS: The patient was taken to the operating room and placed on the operating table in the supine position. The MMF wires were cut and removed. Following induction of intravenous sedation, local anesthetic was infiltrated along the upper and lower gingival mucosa. All of the screws were identified, uncovered by mucosal overgrowth and removed with screwdriver. The upper and lower arch bar were then completely removed. The oral cavity was irrigated with saline and suctioned. The patient was awakened from sedation and transferred to recovery in stable condition.    PATIENT DISPOSITION:  To PACU, stable

## 2017-03-13 ENCOUNTER — Encounter (HOSPITAL_COMMUNITY): Payer: Self-pay | Admitting: Otolaryngology

## 2018-01-12 IMAGING — CT CT MAXILLOFACIAL W/O CM
3 series · 16 of 47 positions shown, 19 images · non-contrast
Comparison: None.

CLINICAL DATA: 41-year-old male with trauma to the jaw.

EXAM:
CT MAXILLOFACIAL WITHOUT CONTRAST
TECHNIQUE: Multidetector CT imaging of the maxillofacial structures was
performed. Multiplanar CT image reconstructions were also generated.
A small metallic BB was placed on the right temple in order to
reliably differentiate right from left.

[Series 3: facial st · axial · 0.35mm/px · z∈[+1280,+1430]mm · 10 of 87 slices shown, 13 images]
[im 6/87  brain]
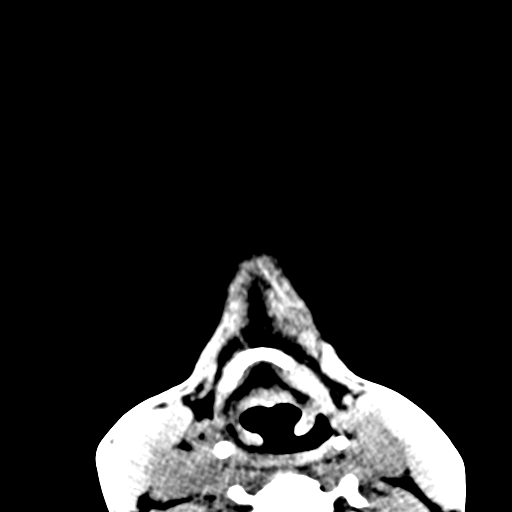
[im 6/87  bone]
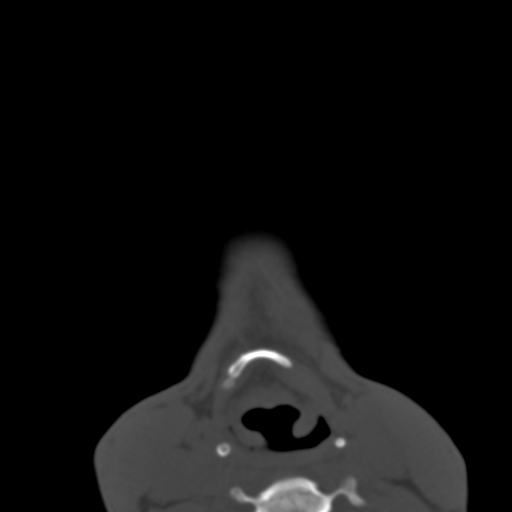
[im 15/87  bone]
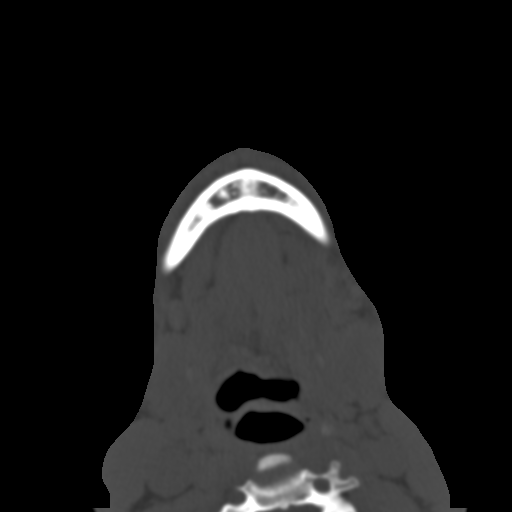
[im 24/87  bone]
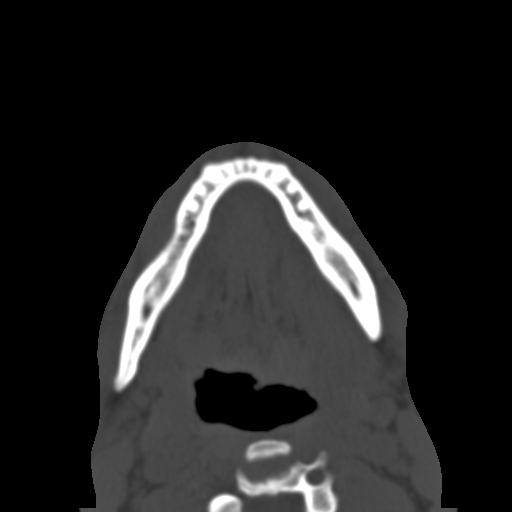
[im 30/87  bone]
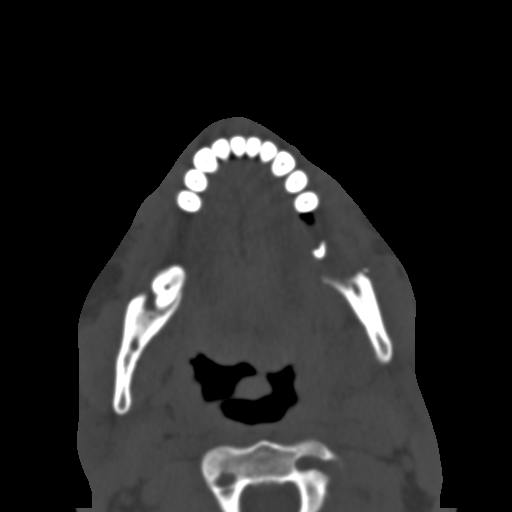
[im 39/87  brain]
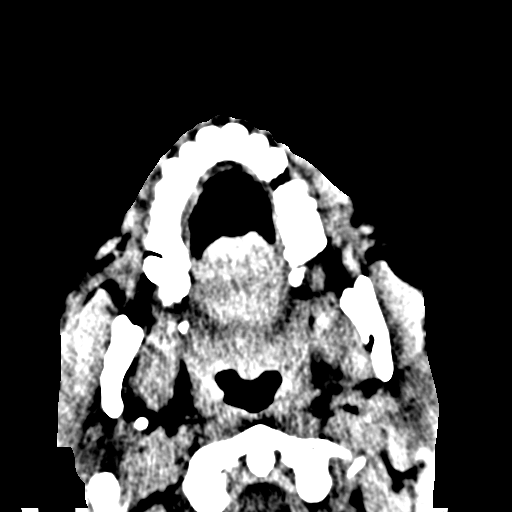
[im 39/87  bone]
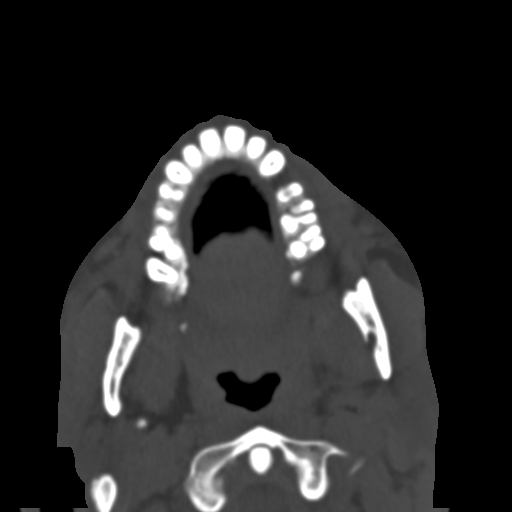
[im 48/87  bone]
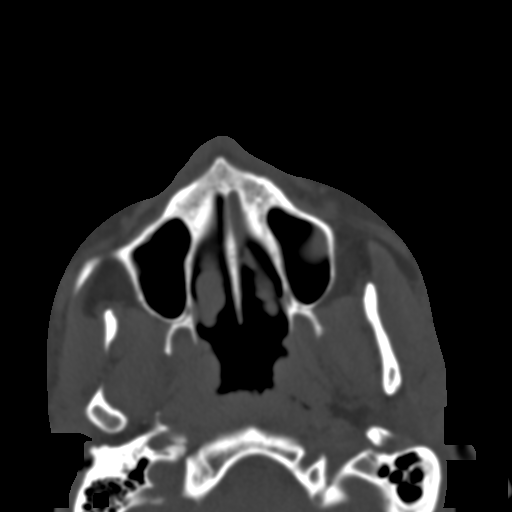
[im 57/87  bone]
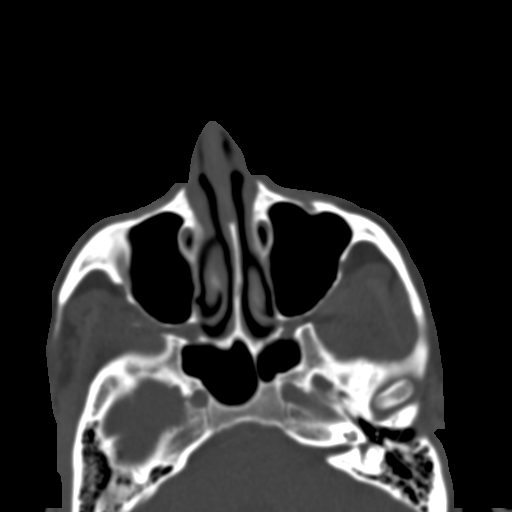
[im 66/87  bone]
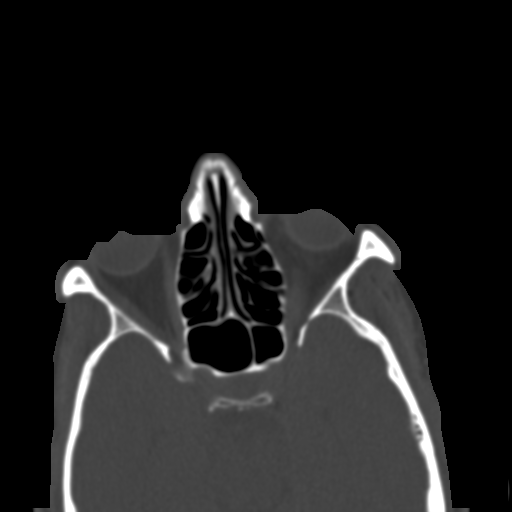
[im 72/87  brain]
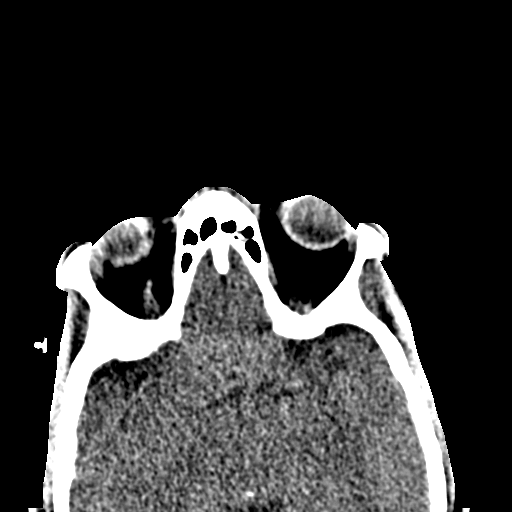
[im 72/87  bone]
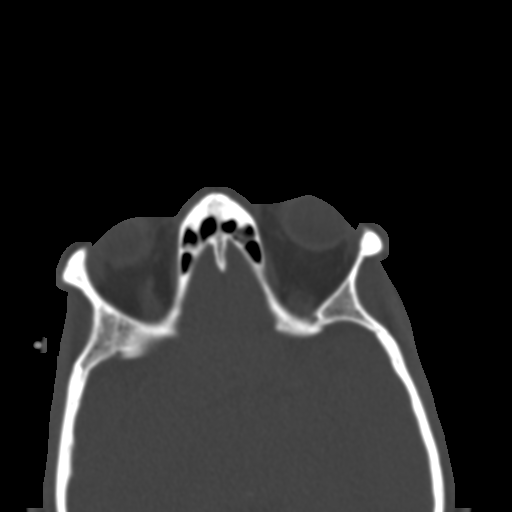
[im 81/87  bone]
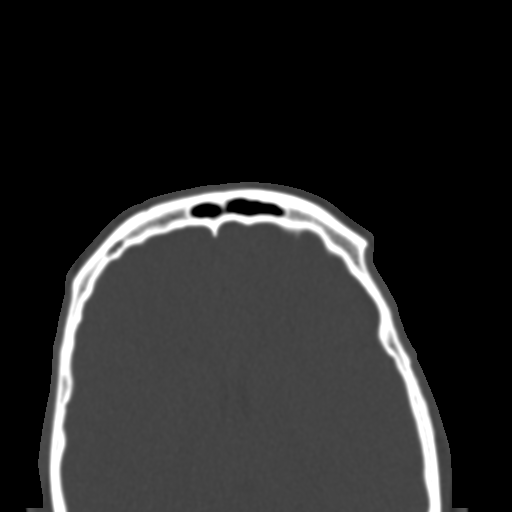

[Series 7: coronal st · coronal · 0.38mm/px · 3 of 76 slices shown]
[im 26/76  bone]
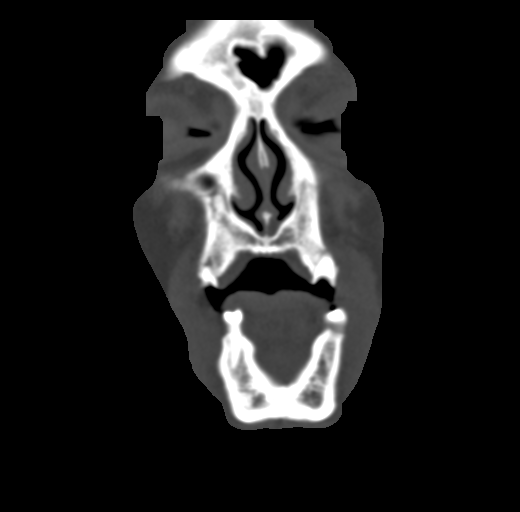
[im 34/76  bone]
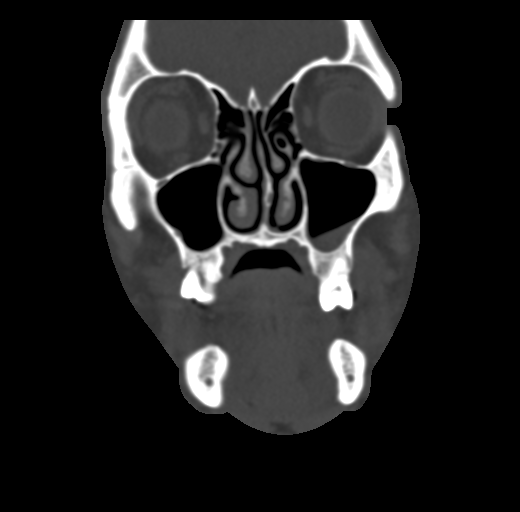
[im 42/76  bone]
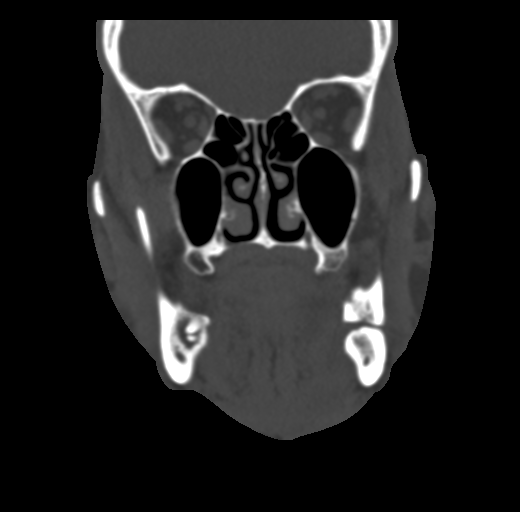

[Series 8: sagittal st · sagittal · 0.36mm/px · 3 of 83 slices shown]
[im 28/83  bone]
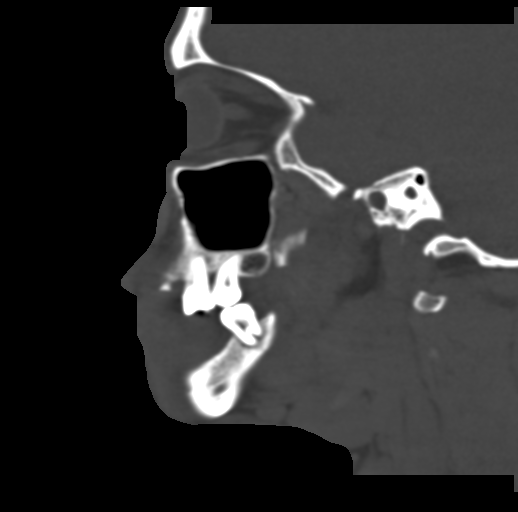
[im 42/83  bone]
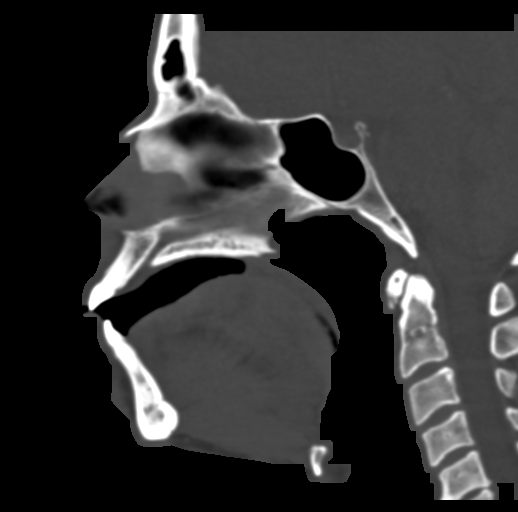
[im 55/83  bone]
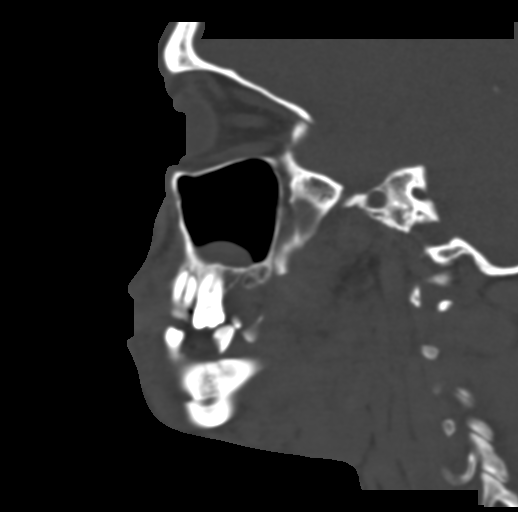

[16 of 47 positions shown; findings below may reference images not displayed]

FINDINGS: Osseous: New there is a displaced fracture of the body of the left
mandible. Fractured tooth fragments noted in the oral cavity. There
is multiple dental caries with for multiple periapical lucencies of
the maxillary and mandibular teeth. Correlation with clinical exam
recommended. There is no dislocation.

Orbits: Negative. No traumatic or inflammatory finding.

Sinuses: There is mild mucoperiosteal thickening of paranasal
sinuses. No air-fluid levels.

Soft tissues: There is mild diffuse soft tissue swelling of the
forehead and lips. No large hematoma or fluid collection.

Limited intracranial: No significant or unexpected finding.
IMPRESSION: 1. Displaced fracture of the left mandible.  No dislocation.
2. Multiple dental caries and periapical lucencies as well as
fractured tooth fragments in the oral cavity. Correlation with
dental exam recommended.

## 2019-11-27 ENCOUNTER — Emergency Department (HOSPITAL_COMMUNITY)
Admission: EM | Admit: 2019-11-27 | Discharge: 2019-11-28 | Disposition: A | Discharge status: Home / Self Care | Payer: BC Managed Care – PPO | Attending: Emergency Medicine | Admitting: Emergency Medicine

## 2019-11-27 ENCOUNTER — Other Ambulatory Visit: Payer: Self-pay

## 2019-11-27 ENCOUNTER — Encounter (HOSPITAL_COMMUNITY): Payer: Self-pay | Admitting: Emergency Medicine

## 2019-11-27 DIAGNOSIS — M79651 Pain in right thigh: Secondary | ICD-10-CM | POA: Diagnosis not present

## 2019-11-27 DIAGNOSIS — R42 Dizziness and giddiness: Secondary | ICD-10-CM | POA: Insufficient documentation

## 2019-11-27 DIAGNOSIS — Z5321 Procedure and treatment not carried out due to patient leaving prior to being seen by health care provider: Secondary | ICD-10-CM | POA: Insufficient documentation

## 2019-11-27 LAB — BASIC METABOLIC PANEL
Anion gap: 10 (ref 5–15)
BUN: 16 mg/dL (ref 6–20)
CO2: 26 mmol/L (ref 22–32)
Calcium: 9.7 mg/dL (ref 8.9–10.3)
Chloride: 102 mmol/L (ref 98–111)
Creatinine, Ser: 0.96 mg/dL (ref 0.61–1.24)
GFR calc Af Amer: >60 mL/min (ref >60)
GFR calc non Af Amer: >60 mL/min (ref >60)
Glucose, Bld: 111 mg/dL — ABNORMAL HIGH (ref 70–99)
Potassium: 4.1 mmol/L (ref 3.5–5.1)
Sodium: 138 mmol/L (ref 135–145)

## 2019-11-27 LAB — CBC
HCT: 44.2 % (ref 39.0–52.0)
Hemoglobin: 14.1 g/dL (ref 13.0–17.0)
MCH: 29.2 pg (ref 26.0–34.0)
MCHC: 31.9 g/dL (ref 30.0–36.0)
MCV: 91.5 fL (ref 80.0–100.0)
Platelets: 274 10*3/uL (ref 150–400)
RBC: 4.83 MIL/uL (ref 4.22–5.81)
RDW: 14.6 % (ref 11.5–15.5)
WBC: 13.1 10*3/uL — ABNORMAL HIGH (ref 4.0–10.5)
nRBC: 0 % (ref 0.0–0.2)

## 2019-11-27 MED ORDER — SODIUM CHLORIDE 0.9% FLUSH: 3.0000 mL | Freq: Once | INTRAVENOUS | Status: DC

## 2019-11-27 NOTE — ED Triage Notes (Signed)
Pt was at urgent care w/ child who had a fever, reported he got very dizzy and light headed, symptoms have resolved.  Has right thigh pain that prevents him from walking for past two days.

## 2019-11-27 NOTE — ED Notes (Signed)
Pt verbalized his decision to leave the ED as a patient. This NT encouraged pt to stay and reminded him that by leaving, his time in the waiting room will start over if he chooses to return to the ED to be seen by a doctor.

## 2019-11-29 ENCOUNTER — Encounter (HOSPITAL_COMMUNITY): Payer: Self-pay

## 2019-11-29 ENCOUNTER — Ambulatory Visit (HOSPITAL_COMMUNITY)
Admission: EM | Admit: 2019-11-29 | Discharge: 2019-11-29 | Disposition: A | Discharge status: Home / Self Care | Payer: BC Managed Care – PPO | Attending: Family Medicine | Admitting: Family Medicine

## 2019-11-29 ENCOUNTER — Ambulatory Visit (INDEPENDENT_AMBULATORY_CARE_PROVIDER_SITE_OTHER): Payer: BC Managed Care – PPO

## 2019-11-29 DIAGNOSIS — M1711 Unilateral primary osteoarthritis, right knee: Secondary | ICD-10-CM

## 2019-11-29 DIAGNOSIS — R52 Pain, unspecified: Secondary | ICD-10-CM | POA: Diagnosis not present

## 2019-11-29 MED ORDER — METHYLPREDNISOLONE ACETATE 80 MG/ML IJ SUSP: 80.0000 mg | Freq: Once | INTRAMUSCULAR | Status: DC

## 2019-11-29 MED ORDER — DICLOFENAC SODIUM 75 MG PO TBEC: 75.0000 mg | DELAYED_RELEASE_TABLET | Freq: Two times a day (BID) | ORAL | 0 refills | Status: AC

## 2019-11-29 MED ORDER — BUPIVACAINE HCL (PF) 0.5 % IJ SOLN
INTRAMUSCULAR | Status: AC
  Filled 2019-11-29: qty 10

## 2019-11-29 MED ORDER — METHYLPREDNISOLONE ACETATE 80 MG/ML IJ SUSP
INTRAMUSCULAR | Status: AC
  Filled 2019-11-29: qty 1

## 2019-11-29 MED ORDER — BUPIVACAINE HCL (PF) 0.5 % IJ SOLN: 1.0000 mL | Freq: Once | INTRAMUSCULAR | Status: DC

## 2019-11-29 NOTE — ED Notes (Signed)
Bed: UC01 Expected date:  Expected time:  Means of arrival:  Comments: Pt in car

## 2019-11-29 NOTE — Discharge Instructions (Addendum)
The x-rays show no problem with the bones.  The area of soreness corresponds to an area of tendon and muscle.

## 2019-11-29 NOTE — ED Provider Notes (Addendum)
[facility]    CSN: [id number] Arrival date & time: 11/29/19  1054      History   Chief Complaint Chief Complaint  Patient presents with  . Leg Pain    HPI Randy Strickland is a 44 y.o. male.   Initial [facility] patient visit  Pt c/o 4/10 aching right thigh pain x 4 days. Pt denies injury. Pt denies numbness and tingling. Pt walked well to exam room.  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  No injury on the job.  No other joints or limbs are sore.  The pain is a constant deep ache.     Past Medical History:  Diagnosis Date  . Medical history non-contributory     Patient Active Problem List   Diagnosis Date Noted  . Mandible fracture (HCC) 01/31/2017  . Fracture, mandible (HCC) 01/31/2017    Past Surgical History:  Procedure Laterality Date  . MANDIBULAR HARDWARE REMOVAL N/A 03/12/2017   Procedure: MANDIBULAR HARDWARE REMOVAL;  Surgeon: Serena Colonel, MD;  Location: [facility] OR;  Service: ENT;  Laterality: N/A;  . ORIF MANDIBULAR FRACTURE N/A 01/31/2017   Procedure: OPEN REDUCTION INTERNAL FIXATION (ORIF) MANDIBULAR FRACTURE;  Surgeon: Serena Colonel, MD;  Location: [facility] OR;  Service: ENT;  Laterality: N/A;       Home Medications    Prior to Admission medications   Medication Sig Start Date End Date Taking? Authorizing Provider  diclofenac (VOLTAREN) 75 MG EC tablet Take 1 tablet (75 mg total) by mouth 2 (two) times daily. 11/29/19   Elvina Sidle, MD    Family History Family History  Problem Relation Age of Onset  . Diabetes Mother   . Stroke Father   . Kidney disease Brother     Social History Social History   Tobacco Use  . Smoking status: Current Every Day Smoker    Packs/day: 0.25  . Smokeless tobacco: Never Used  Substance Use Topics  . Alcohol use: Yes    Alcohol/week: 12.0 standard drinks    Types: 8 Cans of beer, 4 Shots of liquor per week    Comment: every other weekend  . Drug use: No     Allergies   Patient has no known  allergies.   Review of Systems Review of Systems  All other systems reviewed and are negative.    Physical Exam Triage Vital Signs ED Triage Vitals  Enc Vitals Group     BP 11/29/19 1220 122/73     Pulse Rate 11/29/19 1220 69     Resp 11/29/19 1220 16     Temp 11/29/19 1220 98.3 F (36.8 C)     Temp Source 11/29/19 1220 Oral     SpO2 11/29/19 1220 98 %     Weight 11/29/19 1222 155 lb (70.3 kg)     Height 11/29/19 1222 5\' 10"  (1.778 m)     Head Circumference --      Peak Flow --      Pain Score 11/29/19 1222 4     Pain Loc --      Pain Edu? --      Excl. in GC? --    No data found.  Updated Vital Signs BP 122/73 (BP Location: Right Arm)   Pulse 69   Temp 98.3 F (36.8 C) (Oral)   Resp 16   Ht 5\' 10"  (1.778 m)   Wt 70.3 kg   SpO2 98%   BMI 22.24 kg/m    Physical Exam Vitals and  nursing note reviewed.  Constitutional:      General: He is not in acute distress.    Appearance: Normal appearance. He is normal weight. He is not ill-appearing or toxic-appearing.  HENT:     Head: Normocephalic.  Eyes:     Conjunctiva/sclera: Conjunctivae normal.  Cardiovascular:     Rate and Rhythm: Normal rate.  Pulmonary:     Effort: Pulmonary effort is normal.  Musculoskeletal:        General: Tenderness present. No swelling, deformity or signs of injury. Normal range of motion.     Cervical back: Normal range of motion.     Comments: Tender right lateral thigh about 8 inches below right trochanter which is nontender. Good hip, knee, and leg ROM  Skin:    General: Skin is warm and dry.  Neurological:     General: No focal deficit present.     Mental Status: He is alert and oriented to person, place, and time.     Sensory: No sensory deficit.     Motor: No weakness.  Psychiatric:        Mood and Affect: Mood normal.        Behavior: Behavior normal.        Thought Content: Thought content normal.        Judgment: Judgment normal.      UC Treatments / Results    Labs (all labs ordered are listed, but only abnormal results are displayed) Labs Reviewed - No data to display  EKG   Radiology DG Femur Min 2 Views Right  Result Date: 11/29/2019 CLINICAL DATA:  Right thigh pain for the past 4 days.  No injury. EXAM: RIGHT FEMUR 2 VIEWS COMPARISON:  None. FINDINGS: There is no evidence of fracture or other focal bone lesions. Soft tissues are unremarkable. IMPRESSION: Negative. Electronically Signed   By: Titus Dubin M.D.   On: 11/29/2019 13:40    Procedures Join Aspiration/Injection  Date/Time: 11/29/2019 1:20 PM Performed by: Robyn Haber, MD Authorized by: Robyn Haber, MD    this joint aspiration was not done!  The procedure note was entered in error and the computer would not let me delete it  Medications Ordered in UC Medications - No data to display  Initial Impression / Assessment and Plan / UC Course  I have reviewed the triage vital signs and the nursing notes.  Pertinent labs & imaging results that were available during my care of the patient were reviewed by me and considered in my medical decision making (see chart for details).    Final Clinical Impressions(s) / UC Diagnoses   Final diagnoses:  Pain aggravated by walking     Discharge Instructions     The x-rays show no problem with the bones.  The area of soreness corresponds to an area of tendon and muscle.    ED Prescriptions    Medication Sig Dispense Auth. Provider   diclofenac (VOLTAREN) 75 MG EC tablet Take 1 tablet (75 mg total) by mouth 2 (two) times daily. 14 tablet Robyn Haber, MD     I have reviewed the PDMP during this encounter.   Robyn Haber, MD 11/29/19 1338    Robyn Haber, MD 11/29/19 1410

## 2019-11-29 NOTE — ED Triage Notes (Signed)
Pt c/o 4/10 aching right thigh painx4 days. Pt denies injury. Pt denies numbness and tingling. Pt walked well to exam room.

## 2020-06-22 ENCOUNTER — Ambulatory Visit (HOSPITAL_COMMUNITY)
Admission: EM | Admit: 2020-06-22 | Discharge: 2020-06-22 | Disposition: A | Discharge status: Home / Self Care | Payer: Self-pay | Attending: Urgent Care | Admitting: Urgent Care

## 2020-06-22 ENCOUNTER — Encounter (HOSPITAL_COMMUNITY): Payer: Self-pay | Admitting: Emergency Medicine

## 2020-06-22 DIAGNOSIS — R220 Localized swelling, mass and lump, head: Secondary | ICD-10-CM

## 2020-06-22 DIAGNOSIS — K047 Periapical abscess without sinus: Secondary | ICD-10-CM

## 2020-06-22 DIAGNOSIS — R519 Headache, unspecified: Secondary | ICD-10-CM

## 2020-06-22 MED ORDER — NAPROXEN 500 MG PO TABS: 500.0000 mg | ORAL_TABLET | Freq: Two times a day (BID) | ORAL | 0 refills | Status: AC

## 2020-06-22 MED ORDER — AMOXICILLIN-POT CLAVULANATE 875-125 MG PO TABS: 1.0000 | ORAL_TABLET | Freq: Two times a day (BID) | ORAL | 0 refills | Status: AC

## 2020-06-22 MED ORDER — HYDROCODONE-ACETAMINOPHEN 5-325 MG PO TABS: 1.0000 | ORAL_TABLET | ORAL | 0 refills | Status: AC | PRN

## 2020-06-22 NOTE — ED Provider Notes (Signed)
[facility]   MRN: [id number] DOB: 05-15-1976  Subjective:   Randy Strickland is a 44 y.o. male presenting for 5-day history of acute onset persistent and worsening severe pain of the right lower side.  Patient has a history of mandible fracture, sustained in 2018 and is status post surgery.  Denies fever, sore throat, cough, chest pain, shortness of breath.  Patient has been using ibuprofen with some relief.  Plans on going to a dentist as soon as possible.  No current facility-administered medications for this encounter.  Current Outpatient Medications:  .  diclofenac (VOLTAREN) 75 MG EC tablet, Take 1 tablet (75 mg total) by mouth 2 (two) times daily., Disp: 14 tablet, Rfl: 0   No Known Allergies  Past Medical History:  Diagnosis Date  . Medical history non-contributory      Past Surgical History:  Procedure Laterality Date  . MANDIBULAR HARDWARE REMOVAL N/A 03/12/2017   Procedure: MANDIBULAR HARDWARE REMOVAL;  Surgeon: Serena Colonel, MD;  Location: [facility] OR;  Service: ENT;  Laterality: N/A;  . ORIF MANDIBULAR FRACTURE N/A 01/31/2017   Procedure: OPEN REDUCTION INTERNAL FIXATION (ORIF) MANDIBULAR FRACTURE;  Surgeon: Serena Colonel, MD;  Location: [facility] OR;  Service: ENT;  Laterality: N/A;    Family History  Problem Relation Age of Onset  . Diabetes Mother   . Stroke Father   . Kidney disease Brother     Social History   Tobacco Use  . Smoking status: Current Every Day Smoker    Packs/day: 0.25  . Smokeless tobacco: Never Used  Vaping Use  . Vaping Use: Never used  Substance Use Topics  . Alcohol use: Yes    Alcohol/week: 12.0 standard drinks    Types: 8 Cans of beer, 4 Shots of liquor per week    Comment: every other weekend  . Drug use: No    ROS   Objective:   Vitals: BP 133/73 (BP Location: Left Arm)   Pulse (!) 58   Temp 99.2 F (37.3 C) (Oral)   Resp 17   SpO2 99%   Physical Exam Constitutional:      General: He is not in acute  distress.    Appearance: Normal appearance. He is well-developed and normal weight. He is not ill-appearing, toxic-appearing or diaphoretic.  HENT:     Head: Normocephalic and atraumatic.      Right Ear: External ear normal.     Left Ear: External ear normal.     Nose: Nose normal.     Mouth/Throat:     Pharynx: Oropharynx is clear.   Eyes:     General: No scleral icterus.       Right eye: No discharge.        Left eye: No discharge.     Extraocular Movements: Extraocular movements intact.     Pupils: Pupils are equal, round, and reactive to light.  Cardiovascular:     Rate and Rhythm: Normal rate.  Pulmonary:     Effort: Pulmonary effort is normal.  Musculoskeletal:     Cervical back: Normal range of motion.  Neurological:     Mental Status: He is alert and oriented to person, place, and time.  Psychiatric:        Mood and Affect: Mood normal.        Behavior: Behavior normal.        Thought Content: Thought content normal.        Judgment: Judgment normal.  Assessment and Plan :   PDMP not reviewed this encounter.  1. Dental infection   2. Facial pain   3. Facial swelling     Start Augmentin for dental infection/abscess, use naproxen for pain and inflammation. Emphasized need for dental surgeon consult. Counseled patient on potential for adverse effects with medications prescribed/recommended today, strict ER and return-to-clinic precautions discussed, patient verbalized understanding.    Wallis Bamberg, PA-C 06/22/20 1736

## 2020-06-22 NOTE — ED Triage Notes (Signed)
Pt c/o dental pain onset Monday. Pt states that he is experiencing some swelling in his jaw on the right side as well. Pt states he has been taking 800 mg ibuprofen.

## 2020-06-22 NOTE — Discharge Instructions (Addendum)
Please schedule naproxen twice daily with food for your severe pain.  If you still have pain despite taking naproxen regularly, this is breakthrough pain.  You can use hydrocodone, a narcotic pain medicine, once every 4-6 hours for this.  Once your pain is better controlled, switch back to just naproxen.   Make sure you schedule an appointment with a dentist/dental surgeon as soon as possible.  You may try some of the resources below.    [facility] [phone] extension [phone]  Dr. Civils [phone] [address]  [facility] [phone] [address]  [facility] [phone] extension [phone] [place], [place], [address] First come first serve for the first 10 clients.  May do simple extractions only, no wisdom teeth or surgery.  You may try the second for Thursday of the month starting at 6:30 AM.  [employer] You may call the school to see if they are still helping to provide dental care for emergent cases.
